# Patient Record
Sex: Male | Born: 1975 | Race: White | Hispanic: Yes | Marital: Single | Smoking: Never smoker
Health system: Southern US, Community
[De-identification: ages and names within clinical notes are randomized; demographics above are authoritative.]

---

## 2013-10-13 ENCOUNTER — Emergency Department (HOSPITAL_COMMUNITY): Payer: No Typology Code available for payment source

## 2013-10-13 ENCOUNTER — Encounter (HOSPITAL_COMMUNITY): Payer: Self-pay | Admitting: Emergency Medicine

## 2013-10-13 ENCOUNTER — Emergency Department (HOSPITAL_COMMUNITY)
Admission: EM | Admit: 2013-10-13 | Discharge: 2013-10-13 | Disposition: A | Discharge status: Home / Self Care | Payer: No Typology Code available for payment source | Attending: Emergency Medicine | Admitting: Emergency Medicine

## 2013-10-13 DIAGNOSIS — IMO0002 Reserved for concepts with insufficient information to code with codable children: Secondary | ICD-10-CM | POA: Insufficient documentation

## 2013-10-13 DIAGNOSIS — S139XXA Sprain of joints and ligaments of unspecified parts of neck, initial encounter: Secondary | ICD-10-CM | POA: Insufficient documentation

## 2013-10-13 DIAGNOSIS — S161XXA Strain of muscle, fascia and tendon at neck level, initial encounter: Secondary | ICD-10-CM

## 2013-10-13 DIAGNOSIS — R51 Headache: Secondary | ICD-10-CM | POA: Insufficient documentation

## 2013-10-13 DIAGNOSIS — Y9389 Activity, other specified: Secondary | ICD-10-CM | POA: Insufficient documentation

## 2013-10-13 MED ORDER — IBUPROFEN 800 MG PO TABS
800.0000 mg | ORAL_TABLET | Freq: Once | ORAL | Status: AC
  Administered 2013-10-13: 800 mg via ORAL
  Filled 2013-10-13: qty 1

## 2013-10-13 NOTE — ED Notes (Signed)
Patient transported to X-ray 

## 2013-10-13 NOTE — ED Notes (Signed)
Pt discharged home with all belongings, alert and ambulatory upon discharge, no new prescriptions ordered, pt called a taxi for transportation home, no narcotics given in ED
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
## 2013-10-13 NOTE — ED Notes (Signed)
MD at bedside.Ricardo Durierey Czech, MD

## 2013-10-13 NOTE — Discharge Instructions (Signed)
You have neck pain, possibly from a cervical strain and/or pinched nerve.  ° °SEEK IMMEDIATE MEDICAL ATTENTION IF: °You develop difficulties swallowing or breathing.  °You have new or worse numbness, weakness, tingling, or movement problems in your arms or legs.  °You develop increasing pain which is uncontrolled with medications.  °You have change in bowel or bladder function, or other concerns. ° ° ° °
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
